# Patient Record
Sex: Female | Born: 1970 | Race: Black or African American | Hispanic: No | Smoking: Never smoker
Health system: Southern US, Community
[De-identification: ages and names within clinical notes are randomized; demographics above are authoritative.]

## PROBLEM LIST (undated history)

## (undated) HISTORY — PX: OTHER SURGICAL HISTORY: SHX169

---

## 2015-01-21 ENCOUNTER — Emergency Department (HOSPITAL_COMMUNITY)
Admission: EM | Admit: 2015-01-21 | Discharge: 2015-01-22 | Disposition: A | Discharge status: Home / Self Care | Payer: No Typology Code available for payment source | Attending: Emergency Medicine | Admitting: Emergency Medicine

## 2015-01-21 ENCOUNTER — Encounter (HOSPITAL_COMMUNITY): Payer: Self-pay | Admitting: Emergency Medicine

## 2015-01-21 ENCOUNTER — Emergency Department (HOSPITAL_COMMUNITY): Payer: No Typology Code available for payment source

## 2015-01-21 DIAGNOSIS — Z79899 Other long term (current) drug therapy: Secondary | ICD-10-CM | POA: Insufficient documentation

## 2015-01-21 DIAGNOSIS — S161XXA Strain of muscle, fascia and tendon at neck level, initial encounter: Secondary | ICD-10-CM | POA: Diagnosis not present

## 2015-01-21 DIAGNOSIS — Y998 Other external cause status: Secondary | ICD-10-CM | POA: Diagnosis not present

## 2015-01-21 DIAGNOSIS — Y92410 Unspecified street and highway as the place of occurrence of the external cause: Secondary | ICD-10-CM | POA: Diagnosis not present

## 2015-01-21 DIAGNOSIS — Y9389 Activity, other specified: Secondary | ICD-10-CM | POA: Diagnosis not present

## 2015-01-21 DIAGNOSIS — S199XXA Unspecified injury of neck, initial encounter: Secondary | ICD-10-CM | POA: Diagnosis present
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
## 2015-01-22 MED ORDER — CYCLOBENZAPRINE HCL 10 MG PO TABS: 10.0000 mg | ORAL_TABLET | Freq: Three times a day (TID) | ORAL | Status: AC | PRN

## 2015-01-22 MED ORDER — OXYCODONE-ACETAMINOPHEN 5-325 MG PO TABS
1.0000 | ORAL_TABLET | Freq: Once | ORAL | Status: AC
  Administered 2015-01-22: 1 via ORAL
  Filled 2015-01-22: qty 1

## 2015-01-22 MED ORDER — TRAMADOL HCL 50 MG PO TABS
50.0000 mg | ORAL_TABLET | Freq: Four times a day (QID) | ORAL | Status: DC | PRN
Start: 2015-01-22 — End: 2023-07-31

## 2015-01-22 NOTE — Discharge Instructions (Signed)
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
## 2023-07-30 ENCOUNTER — Other Ambulatory Visit: Payer: Self-pay

## 2023-07-30 ENCOUNTER — Encounter (HOSPITAL_COMMUNITY): Payer: Self-pay

## 2023-07-30 ENCOUNTER — Emergency Department (HOSPITAL_COMMUNITY)
Admission: EM | Admit: 2023-07-30 | Discharge: 2023-07-31 | Disposition: A | Discharge status: Home / Self Care | Attending: Emergency Medicine | Admitting: Emergency Medicine

## 2023-07-30 DIAGNOSIS — G4709 Other insomnia: Secondary | ICD-10-CM | POA: Diagnosis not present

## 2023-07-30 DIAGNOSIS — G4700 Insomnia, unspecified: Secondary | ICD-10-CM | POA: Diagnosis present

## 2023-07-30 LAB — COMPREHENSIVE METABOLIC PANEL WITH GFR
ALT: 17 U/L (ref 0–44)
AST: 17 U/L (ref 15–41)
Albumin: 4.3 g/dL (ref 3.5–5.0)
Alkaline Phosphatase: 63 U/L (ref 38–126)
Anion gap: 8 (ref 5–15)
BUN: 7 mg/dL (ref 6–20)
CO2: 27 mmol/L (ref 22–32)
Calcium: 9.5 mg/dL (ref 8.9–10.3)
Chloride: 103 mmol/L (ref 98–111)
Creatinine, Ser: 0.79 mg/dL (ref 0.44–1.00)
GFR, Estimated: >60 mL/min (ref >60)
Glucose, Bld: 150 mg/dL — ABNORMAL HIGH (ref 70–99)
Potassium: 3.7 mmol/L (ref 3.5–5.1)
Sodium: 138 mmol/L (ref 135–145)
Total Bilirubin: 0.6 mg/dL (ref 0.0–1.2)
Total Protein: 7.7 g/dL (ref 6.5–8.1)

## 2023-07-30 LAB — CBC WITH DIFFERENTIAL/PLATELET
Abs Immature Granulocytes: 0.02 10*3/uL (ref 0.00–0.07)
Basophils Absolute: 0.1 10*3/uL (ref 0.0–0.1)
Basophils Relative: 1 %
Eosinophils Absolute: 0.1 10*3/uL (ref 0.0–0.5)
Eosinophils Relative: 2 %
HCT: 44.2 % (ref 36.0–46.0)
Hemoglobin: 14.3 g/dL (ref 12.0–15.0)
Immature Granulocytes: 0 %
Lymphocytes Relative: 30 %
Lymphs Abs: 2.2 10*3/uL (ref 0.7–4.0)
MCH: 27.7 pg (ref 26.0–34.0)
MCHC: 32.4 g/dL (ref 30.0–36.0)
MCV: 85.5 fL (ref 80.0–100.0)
Monocytes Absolute: 0.5 10*3/uL (ref 0.1–1.0)
Monocytes Relative: 6 %
Neutro Abs: 4.7 10*3/uL (ref 1.7–7.7)
Neutrophils Relative %: 61 %
Platelets: 343 10*3/uL (ref 150–400)
RBC: 5.17 MIL/uL — ABNORMAL HIGH (ref 3.87–5.11)
RDW: 15.1 % (ref 11.5–15.5)
WBC: 7.6 10*3/uL (ref 4.0–10.5)
nRBC: 0 % (ref 0.0–0.2)

## 2023-07-30 LAB — ETHANOL: Alcohol, Ethyl (B): <10 mg/dL (ref <10)

## 2023-07-30 LAB — HCG, SERUM, QUALITATIVE: Preg, Serum: NEGATIVE

## 2023-07-30 MED ORDER — LORAZEPAM 1 MG PO TABS
1.0000 mg | ORAL_TABLET | Freq: Once | ORAL | Status: AC
Start: 2023-07-30 — End: 2023-07-30
  Administered 2023-07-30: 1 mg via ORAL
  Filled 2023-07-30: qty 1

## 2023-07-30 NOTE — ED Triage Notes (Signed)
 Pt states she has not slept in 4 days. Pt got off risperidone a week ago and can not sleep since. Denies SI and HI. Pt states "something is wrong with her nervous system".  Denies hallucinations.

## 2023-07-30 NOTE — ED Provider Notes (Signed)
 [place] EMERGENCY DEPARTMENT AT [facility] Provider Note   CSN: [id number] Arrival date & time: 07/30/23  1623     History {Add pertinent medical, surgical, social history, OB history to HPI:1} Chief Complaint  Patient presents with   Insomnia    Shelby Abbott is a 53 y.o. female with PMH as listed below who presents states she has not slept in 4 days.   Presents accompanied by parents. Patient states she stopped risperdone suddenly about week ago and has been having "spasms and tremors" all day today. Having anxiety, trembling in hands/feet/mouth. Stopped it because it was giving her side effects like curling toes/hands. Also reports she hasn't slept since stopping the risperidone, very significant insomnia. Denies any hallucinations, SI or HI. Does not drink alcohol, denies substance use. Denies any other changes to her medications.  Per chart review, patient was inpatient in January 2025 for psychosis. Was seen at [facility] ED on 07/26/23 for same symptoms, given ativan PO which improved her sxs in the ED, and then he also prescribed atarax, which didn't help.   History reviewed. No pertinent past medical history.     Home Medications Prior to Admission medications   Medication Sig Start Date End Date Taking? Authorizing Provider  cyclobenzaprine (FLEXERIL) 10 MG tablet Take 1 tablet (10 mg total) by mouth 3 (three) times daily as needed for muscle spasms. 01/22/15   Gilda Crease, MD  ferrous sulfate 325 (65 FE) MG tablet Take 325 mg by mouth daily with breakfast.    [provider]  ibuprofen (ADVIL,MOTRIN) 200 MG tablet Take 200 mg by mouth every 6 (six) hours as needed for headache or moderate pain.    [provider]  traMADol (ULTRAM) 50 MG tablet Take 1 tablet (50 mg total) by mouth every 6 (six) hours as needed. 01/22/15   Gilda Crease, MD  tranexamic acid (LYSTEDA) 650 MG TABS tablet Take 1,300 mg by mouth 3 (three) times daily.     [provider]      Allergies    Sulfa antibiotics    Review of Systems   Review of Systems A 10 point review of systems was performed and is negative unless otherwise reported in HPI.  Physical Exam Updated Vital Signs BP (!) 145/96 (BP Location: Right Arm)   Pulse 80   Temp 98.1 F (36.7 C) (Oral)   Resp 16   Ht 5\' 3"  (1.6 m)   Wt 68.5 kg   SpO2 100%   BMI 26.75 kg/m  Physical Exam General: Normal appearing female, lying in bed.  HEENT: PERRLA, Sclera anicteric, MMM, trachea midline.  Cardiology: RRR, no murmurs/rubs/gallops.  Resp: Normal respiratory rate and effort. CTAB, no wheezes, rhonchi, crackles.  Abd: Soft, non-tender, non-distended. No rebound tenderness or guarding.  GU: Deferred. MSK: No peripheral edema or signs of trauma. Extremities without deformity or TTP. No cyanosis or clubbing. Skin: warm, dry.  Neuro: A&Ox4, CNs II-XII grossly intact. MAEs. Sensation grossly intact. Obvious trembling of hands/feet/lips at rest Psych: Anxious mood/affect  ED Results / Procedures / Treatments   Labs (all labs ordered are listed, but only abnormal results are displayed) Labs Reviewed  CBC WITH DIFFERENTIAL/PLATELET  COMPREHENSIVE METABOLIC PANEL WITH GFR  HCG, SERUM, QUALITATIVE  RAPID URINE DRUG SCREEN, HOSP PERFORMED  ETHANOL    EKG None  Radiology No results found.  Procedures Procedures  {Document cardiac monitor, telemetry assessment procedure when appropriate:1}  Medications Ordered in ED Medications  LORazepam (ATIVAN) tablet  1 mg (has no administration in time range)    ED Course/ Medical Decision Making/ A&P                          Medical Decision Making Amount and/or Complexity of Data Reviewed Labs: ordered.    This patient presents to the ED for concern of trembling, risperidone, this involves an extensive number of treatment options, and is a complaint that carries with it a high risk of complications and morbidity.  I  considered the following differential and admission for this acute, potentially life threatening condition.   MDM:    ***     Labs: I Ordered, and personally interpreted labs.  The pertinent results include:  ***  Additional history obtained from chart review.  External records from outside source obtained and reviewed including [facility]  Cardiac Monitoring: The patient was maintained on a cardiac monitor.  I personally viewed and interpreted the cardiac monitored which showed an underlying rhythm of: ***  Reevaluation: After the interventions noted above, I reevaluated the patient and found that they have :{resolved/improved/worsened:23923::"improved"}  Social Determinants of Health: Lives independently  Disposition:  ***  Co morbidities that complicate the patient evaluation History reviewed. No pertinent past medical history.   Medicines Meds ordered this encounter  Medications   LORazepam (ATIVAN) tablet 1 mg    I have reviewed the patients home medicines and have made adjustments as needed  Problem List / ED Course: Problem List Items Addressed This Visit   None        {Document critical care time when appropriate:1} {Document review of labs and clinical decision tools ie heart score, Chads2Vasc2 etc:1}  {Document your independent review of radiology images, and any outside records:1} {Document your discussion with family members, caretakers, and with consultants:1} {Document social determinants of health affecting pt's care:1} {Document your decision making why or why not admission, treatments were needed:1}  This note was created using dictation software, which may contain spelling or grammatical errors.

## 2023-07-31 LAB — RAPID URINE DRUG SCREEN, HOSP PERFORMED
Amphetamines: NOT DETECTED
Barbiturates: NOT DETECTED
Benzodiazepines: NOT DETECTED
Cocaine: NOT DETECTED
Opiates: NOT DETECTED
Tetrahydrocannabinol: NOT DETECTED

## 2023-07-31 MED ORDER — LORAZEPAM 1 MG PO TABS
1.0000 mg | ORAL_TABLET | Freq: Once | ORAL | Status: AC
  Administered 2023-07-31: 1 mg via ORAL
  Filled 2023-07-31: qty 1

## 2023-07-31 MED ORDER — TRAZODONE HCL 50 MG PO TABS: 25.0000 mg | ORAL_TABLET | Freq: Every day | ORAL | 0 refills | Status: AC

## 2023-07-31 MED ORDER — TRAZODONE HCL 50 MG PO TABS
25.0000 mg | ORAL_TABLET | Freq: Every day | ORAL | 0 refills | Status: DC
Start: 2023-07-31 — End: 2023-07-31
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
## 2023-07-31 NOTE — ED Provider Notes (Signed)
 I assumed care of this patient from previous provider.  Please see their note for further details of history, exam, and MDM.   Briefly patient is a 52 y.o. female who presented here for insomnia and anxiety.  Presentation is favored to be related to respite all withdrawal.  She was given dose of Ativan.  Currently awaiting TTS consultation for management.  Plan to reassess and f/u TTS recs. Cleared by psych.  DC with low dose trazodone.  The patient appears reasonably screened and/or stabilized for discharge and I doubt any other medical condition or other [facility] requiring further screening, evaluation, or treatment in the ED at this time. I have discussed the findings, Dx and Tx plan with the patient/family who expressed understanding and agree(s) with the plan. Discharge instructions discussed at length. The patient/family was given strict return precautions who verbalized understanding of the instructions. No further questions at time of discharge.  Disposition: Discharge  Condition: Good  ED Discharge Orders          Ordered    traZODone (DESYREL) 50 MG tablet  Daily at bedtime        07/31/23 0981             Follow Up: Primary care provider  Go on 08/03/2023 as scheduled        Vinnie Gombert, Amadeo Garnet, MD 07/31/23 [phone]
